# Patient Record
Sex: Female | Born: 1970
Health system: Southern US, Community
[De-identification: ages and names within clinical notes are randomized; demographics above are authoritative.]

## PROBLEM LIST (undated history)

## (undated) DIAGNOSIS — G43909 Migraine, unspecified, not intractable, without status migrainosus: Secondary | ICD-10-CM

## (undated) DIAGNOSIS — G8929 Other chronic pain: Secondary | ICD-10-CM

## (undated) DIAGNOSIS — M542 Cervicalgia: Secondary | ICD-10-CM

## (undated) DIAGNOSIS — Z8742 Personal history of other diseases of the female genital tract: Secondary | ICD-10-CM

## (undated) DIAGNOSIS — M5412 Radiculopathy, cervical region: Secondary | ICD-10-CM

## (undated) HISTORY — PX: CERVICAL SPINE SURGERY: SHX589

## (undated) HISTORY — PX: APPENDECTOMY: SHX54

---

## 2018-03-25 ENCOUNTER — Other Ambulatory Visit: Payer: Self-pay

## 2018-03-25 ENCOUNTER — Emergency Department: Payer: BLUE CROSS/BLUE SHIELD

## 2018-03-25 ENCOUNTER — Encounter: Payer: Self-pay | Admitting: *Deleted

## 2018-03-25 ENCOUNTER — Emergency Department
Admission: EM | Admit: 2018-03-25 | Discharge: 2018-03-25 | Disposition: A | Discharge status: Home / Self Care | Payer: BLUE CROSS/BLUE SHIELD | Attending: Emergency Medicine | Admitting: Emergency Medicine

## 2018-03-25 DIAGNOSIS — Y939 Activity, unspecified: Secondary | ICD-10-CM | POA: Insufficient documentation

## 2018-03-25 DIAGNOSIS — S6991XA Unspecified injury of right wrist, hand and finger(s), initial encounter: Secondary | ICD-10-CM | POA: Diagnosis present

## 2018-03-25 DIAGNOSIS — F17200 Nicotine dependence, unspecified, uncomplicated: Secondary | ICD-10-CM | POA: Diagnosis not present

## 2018-03-25 DIAGNOSIS — Y92410 Unspecified street and highway as the place of occurrence of the external cause: Secondary | ICD-10-CM | POA: Diagnosis not present

## 2018-03-25 DIAGNOSIS — Y998 Other external cause status: Secondary | ICD-10-CM | POA: Diagnosis not present

## 2018-03-25 DIAGNOSIS — S63641A Sprain of metacarpophalangeal joint of right thumb, initial encounter: Secondary | ICD-10-CM | POA: Diagnosis not present
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
## 2018-03-25 NOTE — Discharge Instructions (Addendum)
Please wear splint at all times until you follow-up with orthopedics.  Keep splint clean and dry.  Continue with ibuprofen as needed for pain.  Return to the emergency department for any worsening symptoms or urgent changes in health.  Call orthopedic office in 1 day to schedule follow-up appoint.

## 2018-06-10 ENCOUNTER — Encounter: Payer: Self-pay | Admitting: Emergency Medicine

## 2018-06-10 ENCOUNTER — Emergency Department
Admission: EM | Admit: 2018-06-10 | Discharge: 2018-06-10 | Disposition: A | Discharge status: Home / Self Care | Payer: PRIVATE HEALTH INSURANCE | Attending: Emergency Medicine | Admitting: Emergency Medicine

## 2018-06-10 ENCOUNTER — Other Ambulatory Visit: Payer: Self-pay

## 2018-06-10 DIAGNOSIS — F17210 Nicotine dependence, cigarettes, uncomplicated: Secondary | ICD-10-CM | POA: Diagnosis not present

## 2018-06-10 DIAGNOSIS — M542 Cervicalgia: Secondary | ICD-10-CM | POA: Diagnosis present

## 2018-06-10 DIAGNOSIS — M5412 Radiculopathy, cervical region: Secondary | ICD-10-CM

## 2018-06-10 MED ORDER — TRAMADOL HCL 50 MG PO TABS: 50.0000 mg | ORAL_TABLET | Freq: Two times a day (BID) | ORAL | 0 refills | Status: AC | PRN

## 2018-06-10 MED ORDER — ORPHENADRINE CITRATE 30 MG/ML IJ SOLN
60.0000 mg | Freq: Two times a day (BID) | INTRAMUSCULAR | Status: DC
  Administered 2018-06-10: 60 mg via INTRAMUSCULAR
  Filled 2018-06-10: qty 2

## 2018-06-10 MED ORDER — METHYLPREDNISOLONE 4 MG PO TBPK: ORAL_TABLET | ORAL | 0 refills | Status: AC

## 2018-06-10 MED ORDER — HYDROMORPHONE HCL 1 MG/ML IJ SOLN
1.0000 mg | Freq: Once | INTRAMUSCULAR | Status: AC
  Administered 2018-06-10: 1 mg via INTRAMUSCULAR
  Filled 2018-06-10: qty 1
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
## 2018-06-10 NOTE — Discharge Instructions (Addendum)
Continue physical therapy and follow-up with treating doctor.  Advised to discuss with PCP neurology consult.
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
## 2018-06-12 ENCOUNTER — Other Ambulatory Visit: Payer: Self-pay | Admitting: Student

## 2018-06-12 DIAGNOSIS — M5412 Radiculopathy, cervical region: Secondary | ICD-10-CM

## 2018-06-19 ENCOUNTER — Emergency Department (HOSPITAL_COMMUNITY)
Admission: EM | Admit: 2018-06-19 | Discharge: 2018-06-19 | Disposition: A | Discharge status: Home / Self Care | Payer: PRIVATE HEALTH INSURANCE | Attending: Emergency Medicine | Admitting: Emergency Medicine

## 2018-06-19 ENCOUNTER — Encounter (HOSPITAL_COMMUNITY): Payer: Self-pay

## 2018-06-19 ENCOUNTER — Emergency Department (HOSPITAL_COMMUNITY): Payer: PRIVATE HEALTH INSURANCE

## 2018-06-19 DIAGNOSIS — G8929 Other chronic pain: Secondary | ICD-10-CM | POA: Insufficient documentation

## 2018-06-19 DIAGNOSIS — M5412 Radiculopathy, cervical region: Secondary | ICD-10-CM | POA: Insufficient documentation

## 2018-06-19 DIAGNOSIS — Z79899 Other long term (current) drug therapy: Secondary | ICD-10-CM | POA: Insufficient documentation

## 2018-06-19 DIAGNOSIS — M542 Cervicalgia: Secondary | ICD-10-CM

## 2018-06-19 DIAGNOSIS — F17210 Nicotine dependence, cigarettes, uncomplicated: Secondary | ICD-10-CM | POA: Insufficient documentation

## 2018-06-19 HISTORY — DX: Radiculopathy, cervical region: M54.12

## 2018-06-19 HISTORY — DX: Migraine, unspecified, not intractable, without status migrainosus: G43.909

## 2018-06-19 HISTORY — DX: Cervicalgia: M54.2

## 2018-06-19 HISTORY — DX: Other chronic pain: G89.29

## 2018-06-19 MED ORDER — METHOCARBAMOL 500 MG PO TABS
1000.0000 mg | ORAL_TABLET | Freq: Once | ORAL | Status: DC
  Filled 2018-06-19: qty 2

## 2018-06-19 MED ORDER — METHOCARBAMOL 500 MG PO TABS: 1000.0000 mg | ORAL_TABLET | Freq: Four times a day (QID) | ORAL | 0 refills | Status: AC | PRN

## 2018-06-19 MED ORDER — ONDANSETRON 4 MG PO TBDP
4.0000 mg | ORAL_TABLET | Freq: Once | ORAL | Status: AC
  Administered 2018-06-19: 4 mg via ORAL
  Filled 2018-06-19: qty 1

## 2018-06-19 MED ORDER — ONDANSETRON 8 MG PO TBDP
8.0000 mg | ORAL_TABLET | Freq: Once | ORAL | Status: AC
  Administered 2018-06-19: 8 mg via ORAL
  Filled 2018-06-19: qty 1

## 2018-06-19 MED ORDER — OXYCODONE-ACETAMINOPHEN 5-325 MG PO TABS
2.0000 | ORAL_TABLET | Freq: Once | ORAL | Status: DC
  Filled 2018-06-19: qty 2

## 2018-06-19 NOTE — Discharge Instructions (Signed)
Take the prescription as directed.  Apply moist heat or ice to the area(s) of discomfort, for 15 minutes at a time, several times per day for the next few days.  Do not fall asleep on a heating or ice pack.  Call your regular Neurosurgeon today to schedule a follow up appointment next week.  Return to the Emergency Department immediately if worsening.

## 2018-06-19 NOTE — ED Notes (Signed)
Bed: ZO10 Expected date:  Expected time:  Means of arrival:  Comments: 47 yo chronic neck pain

## 2018-06-19 NOTE — ED Notes (Addendum)
Patient refusing to put back on aspen collar. Collar at bedside. Patient nauseated and vomited clear liquid once and refusing pain medication at this time. Will check back after zofran kicks in.

## 2018-06-19 NOTE — ED Provider Notes (Signed)
[facility]-EMERGENCY DEPT Provider Note   CSN: [id number] Arrival date & time: 06/19/18  1156     History   Chief Complaint Chief Complaint  Patient presents with  . Neck Pain    HPI Vanessa Church is a 47 y.o. female.  HPI  Pt was seen at 1220. Per pt, c/o gradual onset and persistence of constant acute flair of her chronic neck "pain" for the past several weeks. Denies any change in her usual chronic pain pattern, including radiation into her LUE.  Pain worsens with palpation of the area and body position changes. Pt apparently drove herself to a local mall, then called EMS for transport to the ED. Pt has not been wearing her Aspen collar as instructed by her Neurosurgeon. Pt states she is due to see her Neurosurgeon "for an operation on my neck" next week. Denies incont/retention of bowel or bladder, no saddle anesthesia, no focal motor weakness, no new tingling/numbness in extremities, no fevers, no injury, no abd pain, no CP/SOB.   The symptoms have been associated with no other complaints. The patient has a significant history of similar symptoms previously, recently being evaluated for this complaint and multiple prior evals for same.    Past Medical History:  Diagnosis Date  . Cervical radiculopathy   . Chronic neck pain   . Migraine headache     There are no active problems to display for this patient.   History reviewed. No pertinent surgical history.   OB History   None      Home Medications    Prior to Admission medications   Medication Sig Start Date End Date Taking? Authorizing Provider  gabapentin (NEURONTIN) 300 MG capsule Take 300 mg by mouth 2 (two) times daily.   Yes [provider]  traMADol (ULTRAM) 50 MG tablet Take 50 mg by mouth every 6 (six) hours as needed for moderate pain.   Yes [provider]  methocarbamol (ROBAXIN) 500 MG tablet Take 2 tablets (1,000 mg total) by mouth 4 (four) times daily as needed for  muscle spasms (muscle spasm/pain). 06/19/18   Samuel Jester, DO  methylPREDNISolone (MEDROL DOSEPAK) 4 MG TBPK tablet Take Tapered dose as directed Patient not taking: Reported on 06/19/2018 06/10/18   Joni Reining, PA-C  traMADol (ULTRAM) 50 MG tablet Take 1 tablet (50 mg total) by mouth every 12 (twelve) hours as needed. Patient not taking: Reported on 06/19/2018 06/10/18   Joni Reining, PA-C    Family History History reviewed. No pertinent family history.  Social History Social History   Tobacco Use  . Smoking status: Current Some Day Smoker  . Smokeless tobacco: Never Used  Substance Use Topics  . Alcohol use: Yes    Frequency: Never  . Drug use: Never     Allergies   Methylprednisolone acetate; Penicillins; and Sulfamethoxazole-trimethoprim   Review of Systems Review of Systems ROS: Statement: All systems negative except as marked or noted in the HPI; Constitutional: Negative for fever and chills. ; ; Eyes: Negative for eye pain, redness and discharge. ; ; ENMT: Negative for ear pain, hoarseness, nasal congestion, sinus pressure and sore throat. ; ; Cardiovascular: Negative for chest pain, palpitations, diaphoresis, dyspnea and peripheral edema. ; ; Respiratory: Negative for cough, wheezing and stridor. ; ; Gastrointestinal: Negative for nausea, vomiting, diarrhea, abdominal pain, blood in stool, hematemesis, jaundice and rectal bleeding. . ; ; Genitourinary: Negative for dysuria, flank pain and hematuria. ; ; Musculoskeletal: Negative for back pain. +  chronic neck pain. Negative for swelling and trauma.; ; Skin: Negative for pruritus, rash, abrasions, blisters, bruising and skin lesion.; ; Neuro: Negative for headache, lightheadedness and neck stiffness. Negative for weakness, altered level of consciousness, altered mental status, extremity weakness, paresthesias, involuntary movement, seizure and syncope.       Physical Exam Updated Vital Signs BP (!) 147/85 (BP  Location: Right Arm)   Pulse 72   Temp 97.6 F (36.4 C) (Oral)   Resp 17   LMP 05/20/2018   SpO2 100%   Physical Exam 1225: Physical examination:  Nursing notes reviewed; Vital signs and O2 SAT reviewed;  Constitutional: Well developed, Well nourished, Well hydrated, Screaming repetitively; Head:  Normocephalic, atraumatic; Eyes: EOMI, PERRL, No scleral icterus; ENMT: Mouth and pharynx normal, Mucous membranes moist; Neck: Supple, Full range of motion, No lymphadenopathy; Cardiovascular: Regular rate and rhythm, No gallop; Respiratory: Breath sounds clear & equal bilaterally, No wheezes.  Speaking full sentences with ease, Normal respiratory effort/excursion; Chest: Nontender, Movement normal; Abdomen: Soft, Nontender, Nondistended, Normal bowel sounds; Genitourinary: No CVA tenderness; Spine:  No midline CS, TS, LS tenderness. +TTP left cervical and trapezius muscle.;; Extremities: Peripheral pulses normal, No tenderness, No edema, No calf edema or asymmetry.; Neuro: AA&Ox3, Major CN grossly intact.  Speech clear. Grips equal. No gross focal motor or sensory deficits in extremities.; Skin: Color normal, Warm, Dry.; Psych:  Screaming on arrival.     ED Treatments / Results  Labs (all labs ordered are listed, but only abnormal results are displayed)   EKG None  Radiology   Procedures Procedures (including critical care time)  Medications Ordered in ED Medications  oxyCODONE-acetaminophen (PERCOCET/ROXICET) 5-325 MG per tablet 2 tablet (2 tablets Oral Not Given 06/19/18 1436)  methocarbamol (ROBAXIN) tablet 1,000 mg (1,000 mg Oral Not Given 06/19/18 1437)  ondansetron (ZOFRAN-ODT) disintegrating tablet 8 mg (8 mg Oral Given 06/19/18 1228)  ondansetron (ZOFRAN-ODT) disintegrating tablet 4 mg (4 mg Oral Given 06/19/18 1303)     Initial Impression / Assessment and Plan / ED Course  I have reviewed the triage vital signs and the nursing notes.  Pertinent labs & imaging results that were  available during my care of the patient were reviewed by me and considered in my medical decision making (see chart for details).  MDM Reviewed: previous chart, nursing note and vitals Interpretation: CT scan    Ct Cervical Spine Wo Contrast Result Date: 06/19/2018 CLINICAL DATA:  Severe neck pain. EXAM: CT CERVICAL SPINE WITHOUT CONTRAST TECHNIQUE: Multidetector CT imaging of the cervical spine was performed without intravenous contrast. Multiplanar CT image reconstructions were also generated. COMPARISON:  None. FINDINGS: Alignment: Normal. Skull base and vertebrae: No acute fracture. No primary bone lesion or focal pathologic process. Soft tissues and spinal canal: No prevertebral fluid or swelling. No visible canal hematoma. Small calcification in the nuchal ligament at C5-6, not felt to be significant. Disc levels:  C2-3: Normal. C3-4: Normal. C4-5: Normal. C5-6: Slight calcification of the posterior longitudinal ligament to the left of midline behind the body of C6. No disc protrusion or bulging. C6-7: Small posterior endplate osteophytes. No discrete disc protrusion. C7-T1: Normal. There is no foraminal or spinal stenosis. No significant facet arthritis. Upper chest: Negative. Other: None IMPRESSION: 1. No acute abnormality of the cervical spine. 2. Minimal degenerative changes at C5-6 and C6-7. 3. No discrete disc protrusions or neural impingement. Electronically Signed   By: Francene Boyers M.D.   On: 06/19/2018 13:51    1540:  Pt refusing  meds. Refusing to wear her Aspen collar, stating "it makes it so I can't move." ED RN and I explained the purpose of the Aspen collar, and strongly encouraged her to wear it. Pt's significant husband at bedside verb understanding and strongly encouraged pt to wear collar. Dx and testing d/w pt and family.  Questions answered.  Verb understanding, agreeable to d/c home with outpt f/u.     Final Clinical Impressions(s) / ED Diagnoses   Final diagnoses:    Chronic neck pain  Cervical radiculopathy    ED Discharge Orders         Ordered    methocarbamol (ROBAXIN) 500 MG tablet  4 times daily PRN     06/19/18 1540           Samuel Jester, DO 06/21/18 1557

## 2018-06-19 NOTE — ED Notes (Signed)
Patient given water. Patient refusing to challenge.
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
## 2020-04-19 ENCOUNTER — Ambulatory Visit: Admit: 2020-04-19 | Discharge status: Against Medical Advice | Payer: Self-pay

## 2020-04-20 ENCOUNTER — Ambulatory Visit: Payer: Self-pay

## 2020-06-23 ENCOUNTER — Ambulatory Visit
Admission: EM | Admit: 2020-06-23 | Discharge: 2020-06-23 | Disposition: A | Discharge status: Home / Self Care | Payer: 59

## 2020-06-23 ENCOUNTER — Other Ambulatory Visit: Payer: Self-pay

## 2020-06-23 ENCOUNTER — Encounter: Payer: Self-pay | Admitting: Emergency Medicine

## 2020-06-23 DIAGNOSIS — R112 Nausea with vomiting, unspecified: Secondary | ICD-10-CM | POA: Diagnosis not present

## 2020-06-23 DIAGNOSIS — H53149 Visual discomfort, unspecified: Secondary | ICD-10-CM

## 2020-06-23 DIAGNOSIS — G43909 Migraine, unspecified, not intractable, without status migrainosus: Secondary | ICD-10-CM

## 2020-06-23 HISTORY — DX: Personal history of other diseases of the female genital tract: Z87.42

## 2020-06-23 MED ORDER — KETOROLAC TROMETHAMINE 10 MG PO TABS: 10.0000 mg | ORAL_TABLET | Freq: Four times a day (QID) | ORAL | 0 refills | Status: AC | PRN

## 2020-06-23 MED ORDER — KETOROLAC TROMETHAMINE 60 MG/2ML IM SOLN
60.0000 mg | Freq: Once | INTRAMUSCULAR | Status: AC
  Administered 2020-06-23: 60 mg via INTRAMUSCULAR

## 2020-06-23 MED ORDER — SUMATRIPTAN SUCCINATE 50 MG PO TABS: 50.0000 mg | ORAL_TABLET | ORAL | 0 refills | Status: AC | PRN

## 2020-06-23 NOTE — Discharge Instructions (Signed)
HEADACHE: You were seen in clinic today for headache. Rest and take meds as directed. If at any point, the headache becomes very severe, is associated with fever, is associated with neck pain/stiffness, you feel like passing out, the headache is different from any you've have had before, there are vision changes/issues with speech/issues with balance, or numbness/weakness in a part of the body, you should be seen urgently or emergently for more serious causes of headache  °

## 2020-06-23 NOTE — ED Triage Notes (Signed)
Patient in today c/o a migraine headache since last night. Patient states she has a history of migraines. Patient does not have any medication to take for her migraine. Patient has not taken any OTC medications for her headache.

## 2020-06-23 NOTE — ED Provider Notes (Signed)
[facility]    CSN: [id number] Arrival date & time: 06/23/20  1100      History   Chief Complaint Chief Complaint  Patient presents with   Migraine    HPI Vanessa Church is a 49 y.o. female presenting for migraine that began last night.  She states that the frontal region of the head and also her neck hurts.  She also admits to nausea and vomiting x1.  Admits to photosensitivity.  Denies any vision changes.  Patient does have a history of migraines.  She cannot remember what it is that she took in the past that seemed to help her migraines.  She does not admit to frequent migraines and says it has been a long time since her last migraine.  She does admit to chronic neck issues and has had previous neck surgery.  Patient has not tried over-the-counter headache relief medications for her headache.  She says that she was prescribed nausea medication from an urgent care last week that she is clinical pick up after she leaves here.  Patient states that her current headache is consistent with previous migraines.  She denies any severe headache, dizziness, weakness, falls or syncope, chest pain or breathing difficulty.  Patient has a history of COVID-19 infection a month ago.  No other complaints or concerns today.  HPI  Past Medical History:  Diagnosis Date   Cervical radiculopathy    Chronic neck pain    History of endometriosis    Migraine headache     There are no problems to display for this patient.   Past Surgical History:  Procedure Laterality Date   APPENDECTOMY     CERVICAL SPINE SURGERY      OB History   No obstetric history on file.      Home Medications    Prior to Admission medications   Medication Sig Start Date End Date Taking? Authorizing Provider  cyclobenzaprine (FLEXERIL) 5 MG tablet Take by mouth daily as needed. 06/06/20  Yes [provider]  gabapentin (NEURONTIN) 300 MG capsule Take 300 mg by mouth 2 (two) times daily.     [provider]  ketorolac (TORADOL) 10 MG tablet Take 1 tablet (10 mg total) by mouth every 6 (six) hours as needed for up to 5 days. 06/23/20 06/28/20  Shirlee Latch, PA-C  methocarbamol (ROBAXIN) 500 MG tablet Take 2 tablets (1,000 mg total) by mouth 4 (four) times daily as needed for muscle spasms (muscle spasm/pain). 06/19/18   Samuel Jester, DO  methylPREDNISolone (MEDROL DOSEPAK) 4 MG TBPK tablet Take Tapered dose as directed Patient not taking: Reported on 06/19/2018 06/10/18   Joni Reining, PA-C  SUMAtriptan (IMITREX) 50 MG tablet Take 1 tablet (50 mg total) by mouth every 2 (two) hours as needed for up to 10 days for migraine. May repeat in 2 hours if headache persists or recurs. 06/23/20 07/03/20  Shirlee Latch, PA-C  traMADol (ULTRAM) 50 MG tablet Take 1 tablet (50 mg total) by mouth every 12 (twelve) hours as needed. Patient not taking: Reported on 06/19/2018 06/10/18   Joni Reining, PA-C  traMADol (ULTRAM) 50 MG tablet Take 50 mg by mouth every 6 (six) hours as needed for moderate pain.    [provider]    Family History Family History  Adopted: Yes  Family history unknown: Yes    Social History Social History   Tobacco Use   Smoking status: Former Smoker    Packs/day: 2.00  Years: 30.00    Pack years: 60.00    Types: Cigarettes    Quit date: 06/23/2018    Years since quitting: 2.0   Smokeless tobacco: Never Used  Vaping Use   Vaping Use: Never used  Substance Use Topics   Alcohol use: Yes    Comment: social   Drug use: Never     Allergies   Methylprednisolone acetate, Methylprednisolone sodium succ, Penicillins, Tramadol, and Sulfamethoxazole-trimethoprim   Review of Systems Review of Systems  Constitutional: Negative for chills, diaphoresis, fatigue and fever.  HENT: Negative for congestion, rhinorrhea, sinus pain and sore throat.   Eyes: Positive for photophobia. Negative for visual disturbance.  Respiratory:  Negative for cough and shortness of breath.   Gastrointestinal: Positive for nausea and vomiting. Negative for abdominal pain.  Musculoskeletal: Negative for arthralgias and myalgias.  Skin: Negative for rash.  Neurological: Positive for headaches. Negative for dizziness, syncope, weakness and numbness.     Physical Exam Triage Vital Signs ED Triage Vitals  Enc Vitals Group     BP 06/23/20 1142 (!) 138/92     Pulse Rate 06/23/20 1142 83     Resp 06/23/20 1142 18     Temp 06/23/20 1142 98.2 F (36.8 C)     Temp Source 06/23/20 1142 Oral     SpO2 06/23/20 1142 98 %     Weight 06/23/20 1143 160 lb (72.6 kg)     Height 06/23/20 1143 5' (1.524 m)     Head Circumference --      Peak Flow --      Pain Score 06/23/20 1142 7     Pain Loc --      Pain Edu? --      Excl. in GC? --    No data found.  Updated Vital Signs BP (!) 138/92 (BP Location: Left Arm)    Pulse 83    Temp 98.2 F (36.8 C) (Oral)    Resp 18    Ht 5' (1.524 m)    Wt 160 lb (72.6 kg)    LMP 06/02/2020 (Approximate)    SpO2 98%    BMI 31.25 kg/m       Physical Exam Vitals and nursing note reviewed.  Constitutional:      General: She is not in acute distress.    Appearance: Normal appearance. She is not ill-appearing or toxic-appearing.  HENT:     Head: Normocephalic and atraumatic.     Nose: Nose normal.  Eyes:     General: No scleral icterus.       Right eye: No discharge.        Left eye: No discharge.     Extraocular Movements: Extraocular movements intact.     Conjunctiva/sclera: Conjunctivae normal.     Pupils: Pupils are equal, round, and reactive to light.     Comments: +photosensitivity   Cardiovascular:     Rate and Rhythm: Normal rate.  Pulmonary:     Effort: Pulmonary effort is normal. No respiratory distress.  Musculoskeletal:     Cervical back: Neck supple.  Skin:    General: Skin is dry.  Neurological:     General: No focal deficit present.     Mental Status: She is alert and oriented  to person, place, and time. Mental status is at baseline.     Motor: No weakness.     Gait: Gait normal.  Psychiatric:        Mood and Affect: Mood normal.  Behavior: Behavior normal.        Thought Content: Thought content normal.      UC Treatments / Results  Labs (all labs ordered are listed, but only abnormal results are displayed) Labs Reviewed - No data to display  EKG   Radiology No results found.  Procedures Procedures (including critical care time)  Medications Ordered in UC Medications  ketorolac (TORADOL) injection 60 mg (has no administration in time range)    Initial Impression / Assessment and Plan / UC Course  I have reviewed the triage vital signs and the nursing notes.  Pertinent labs & imaging results that were available during my care of the patient were reviewed by me and considered in my medical decision making (see chart for details).   49 year old female presenting with migraine today.  Patient given 60 mg IM ketorolac.  Prescription written for 10 mg ketorolac.  I was able to review review her medical records and find that she has been given sumatriptan in the past.  I believe this may have been what works for her.  I have prescribed this for her today.  Advise rest and increasing fluids.  Work note given for couple of days.  Advised to pick up her nausea medication as prescribed as well.  ED precautions for headaches discussed with patient.   Final Clinical Impressions(s) / UC Diagnoses   Final diagnoses:  Migraine without status migrainosus, not intractable, unspecified migraine type  Photophobia  Non-intractable vomiting with nausea, unspecified vomiting type     Discharge Instructions     HEADACHE: You were seen in clinic today for headache. Rest and take meds as directed. If at any point, the headache becomes very severe, is associated with fever, is associated with neck pain/stiffness, you feel like passing out, the headache is  different from any you've have had before, there are vision changes/issues with speech/issues with balance, or numbness/weakness in a part of the body, you should be seen urgently or emergently for more serious causes of headache     ED Prescriptions    Medication Sig Dispense Auth. Provider   ketorolac (TORADOL) 10 MG tablet Take 1 tablet (10 mg total) by mouth every 6 (six) hours as needed for up to 5 days. 20 tablet Eusebio Friendly B, PA-C   SUMAtriptan (IMITREX) 50 MG tablet Take 1 tablet (50 mg total) by mouth every 2 (two) hours as needed for up to 10 days for migraine. May repeat in 2 hours if headache persists or recurs. 10 tablet Gareth Morgan     PDMP not reviewed this encounter.   Shirlee Latch, PA-C 06/23/20 1254

## 2020-06-25 IMAGING — CT CT CERVICAL SPINE W/O CM
3 of 4 series · 12 of 34 positions shown, 14 images · non-contrast
Comparison: None.

CLINICAL DATA: Severe neck pain.

EXAM:
CT CERVICAL SPINE WITHOUT CONTRAST
TECHNIQUE: Multidetector CT imaging of the cervical spine was performed without
intravenous contrast. Multiplanar CT image reconstructions were also
generated.

[Series 7: orthogonal bone · axial · 0.24mm/px · z∈[+631,+751]mm · 4 of 100 slices shown, 5 images]
[im 17/100  soft-tissue]
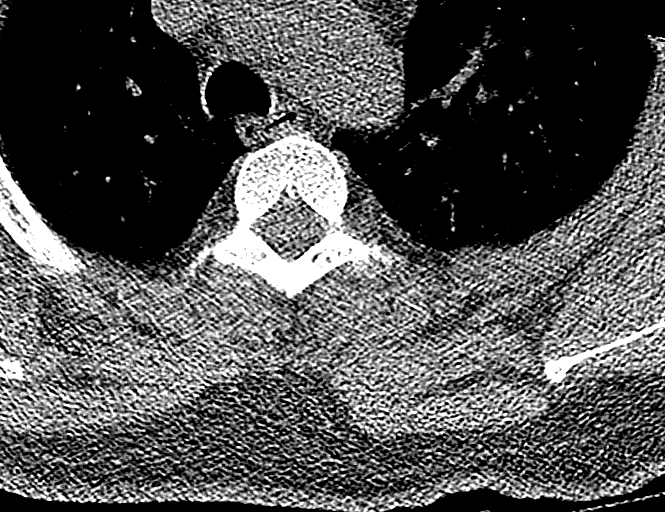
[im 17/100  bone]
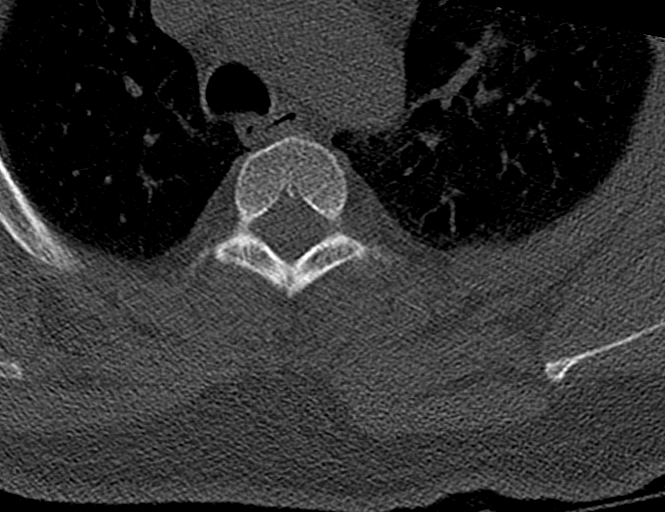
[im 34/100  bone]
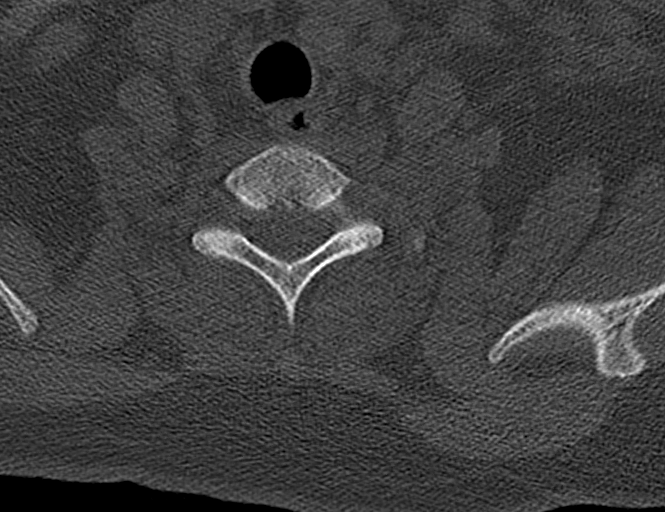
[im 67/100  bone]
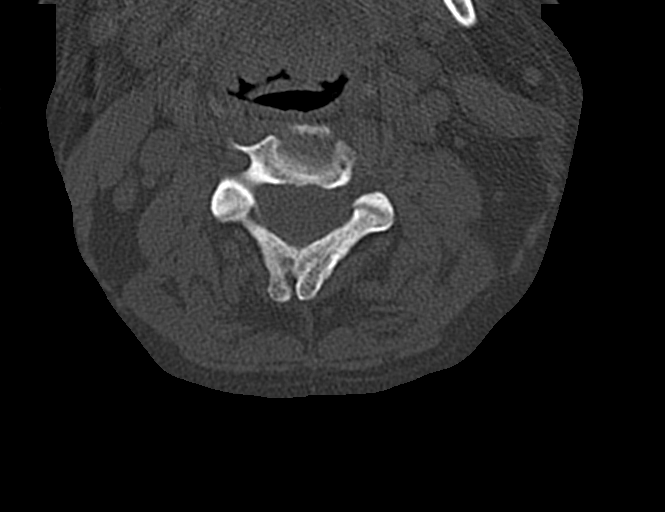
[im 83/100  bone]
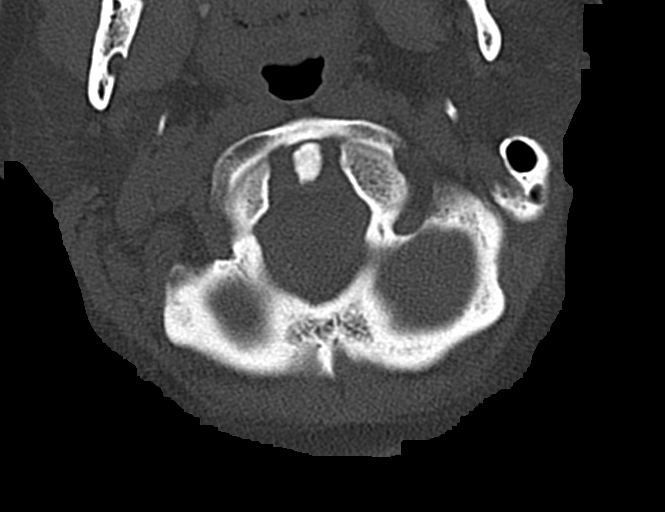

[Series 8: coronal bone · coronal · 0.31mm/px · 3 of 63 slices shown]
[im 14/63  bone]
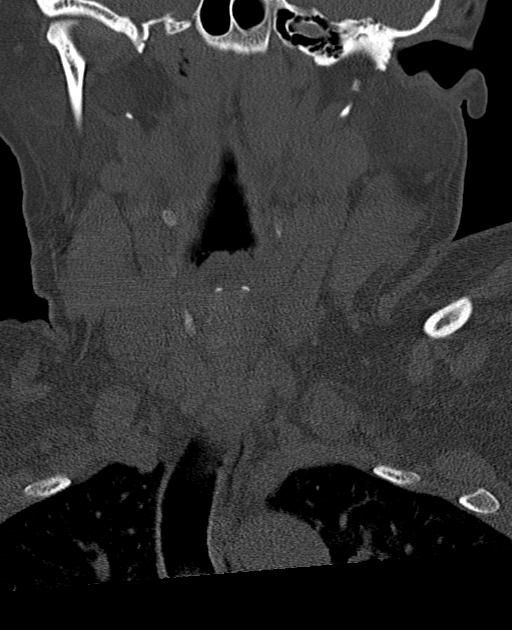
[im 26/63  bone]
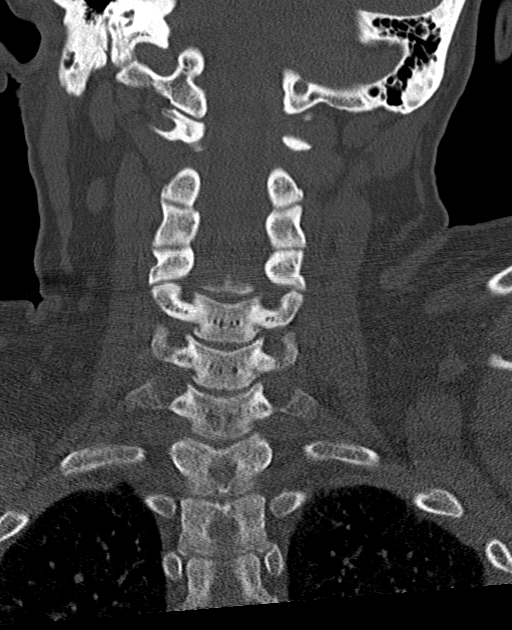
[im 38/63  bone]
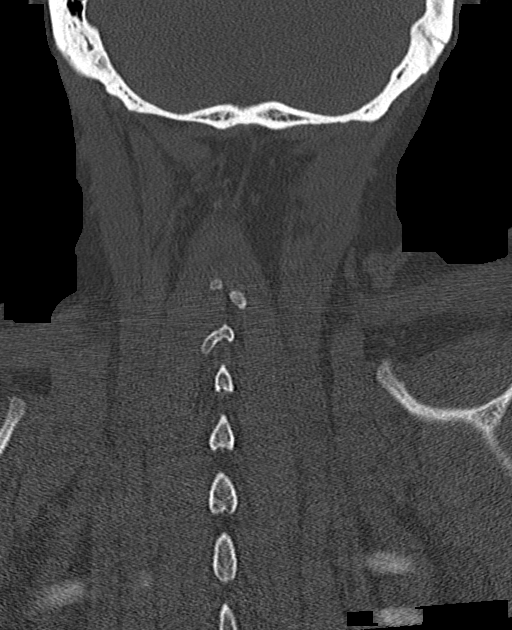

[Series 9: sagittal bone · sagittal · 0.24mm/px · 5 of 81 slices shown, 6 images]
[im 27/81  bone]
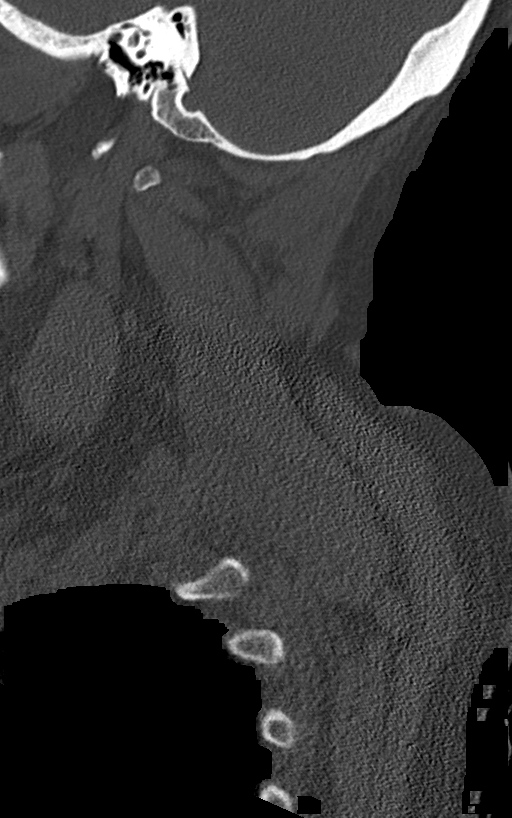
[im 34/81  bone]
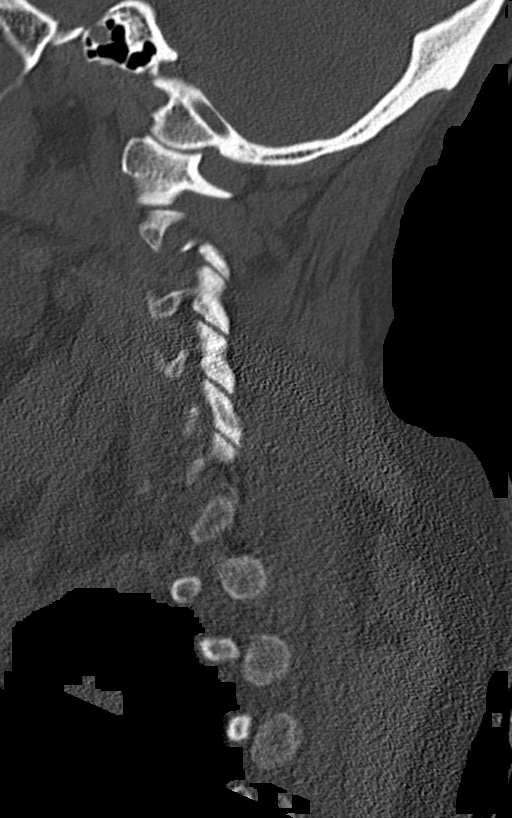
[im 41/81  soft-tissue]
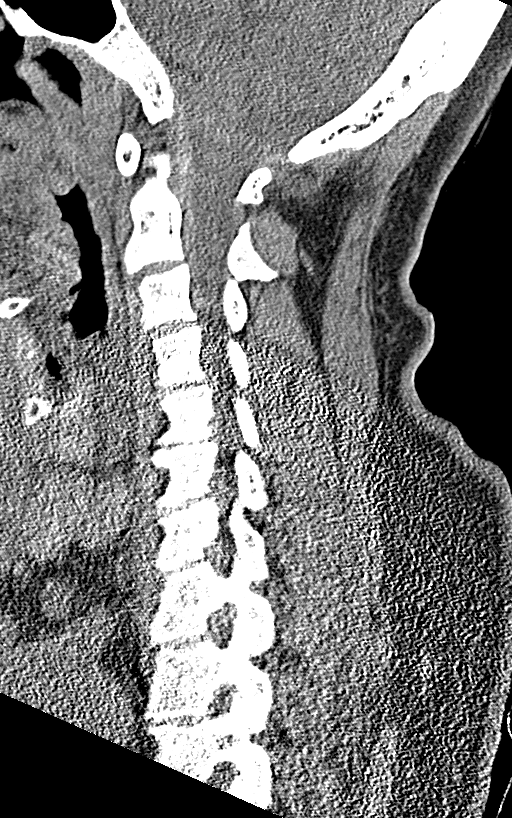
[im 41/81  bone]
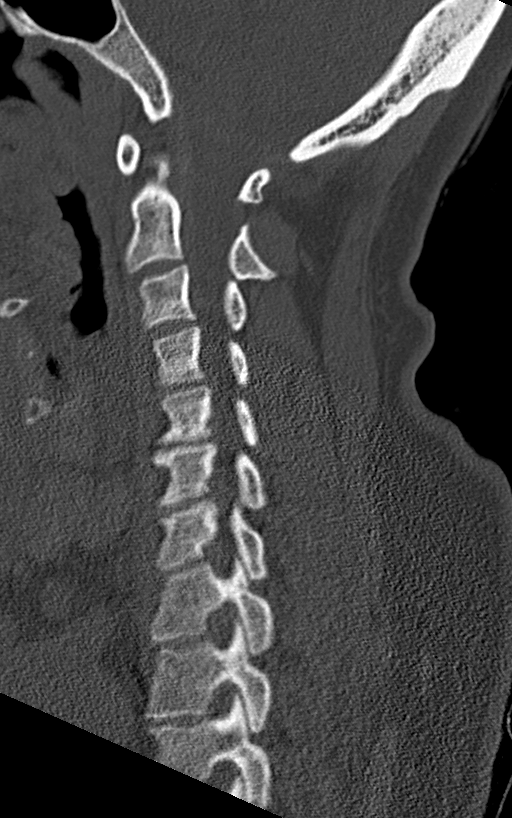
[im 47/81  bone]
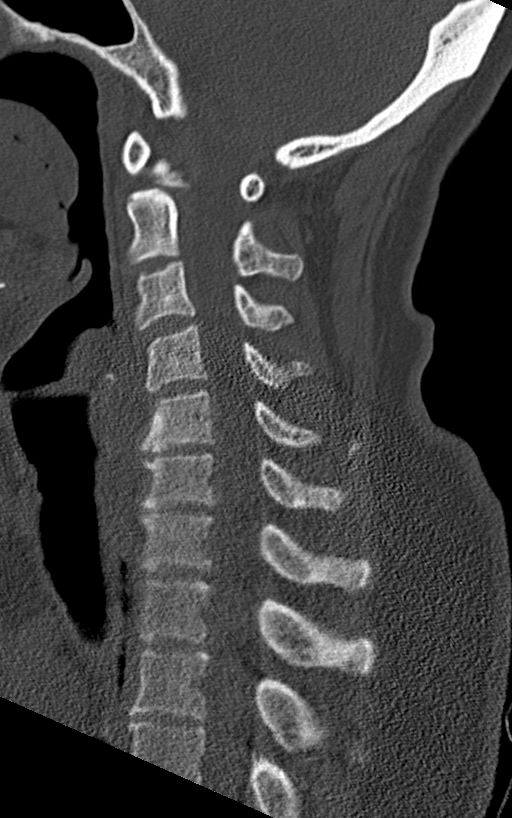
[im 54/81  bone]
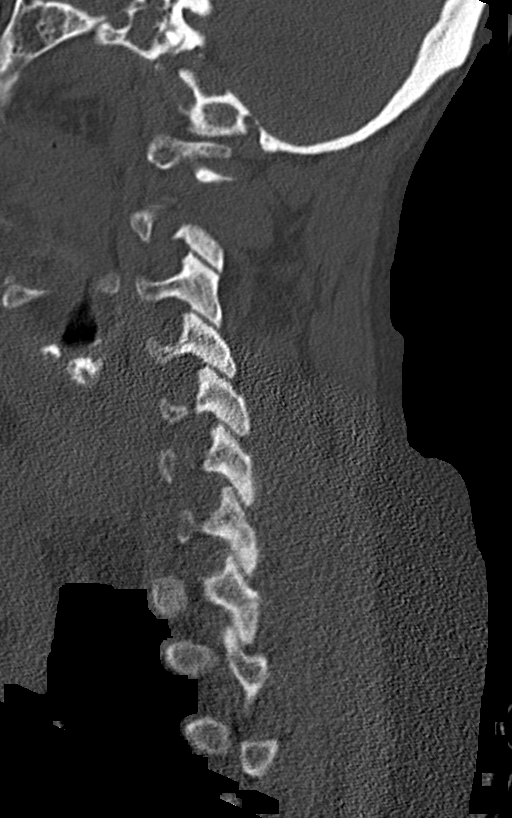

[12 of 34 positions shown; findings below may reference images not displayed]

FINDINGS: Alignment: Normal.

Skull base and vertebrae: No acute fracture. No primary bone lesion
or focal pathologic process.

Soft tissues and spinal canal: No prevertebral fluid or swelling. No
visible canal hematoma. Small calcification in the nuchal ligament
at C5-6, not felt to be significant.

Disc levels:  C2-3: Normal.

C3-4: Normal.

C4-5: Normal.

C5-6: Slight calcification of the posterior longitudinal ligament to
the left of midline behind the body of C6. No disc protrusion or
bulging.

C6-7: Small posterior endplate osteophytes. No discrete disc
protrusion.

C7-T1: Normal.

There is no foraminal or spinal stenosis. No significant facet
arthritis.

Upper chest: Negative.

Other: None
IMPRESSION: 1. No acute abnormality of the cervical spine.
2. Minimal degenerative changes at C5-6 and C6-7.
3. No discrete disc protrusions or neural impingement.
